# Patient Record
Sex: Female | Born: 1960
Health system: Southern US, Community
[De-identification: ages and names within clinical notes are randomized; demographics above are authoritative.]

## PROBLEM LIST (undated history)

## (undated) DIAGNOSIS — I639 Cerebral infarction, unspecified: Secondary | ICD-10-CM

## (undated) DIAGNOSIS — I10 Essential (primary) hypertension: Secondary | ICD-10-CM

## (undated) DIAGNOSIS — E119 Type 2 diabetes mellitus without complications: Secondary | ICD-10-CM

---

## 2007-03-11 ENCOUNTER — Emergency Department (HOSPITAL_COMMUNITY): Admission: EM | Admit: 2007-03-11 | Discharge: 2007-03-11 | Payer: Self-pay | Admitting: Emergency Medicine

## 2012-03-11 ENCOUNTER — Other Ambulatory Visit (HOSPITAL_COMMUNITY)
Admission: RE | Admit: 2012-03-11 | Discharge: 2012-03-11 | Disposition: A | Discharge status: Home / Self Care | Payer: Commercial Indemnity | Source: Ambulatory Visit | Attending: Family Medicine | Admitting: Family Medicine

## 2012-03-11 DIAGNOSIS — Z124 Encounter for screening for malignant neoplasm of cervix: Secondary | ICD-10-CM | POA: Insufficient documentation

## 2012-03-11 DIAGNOSIS — Z1151 Encounter for screening for human papillomavirus (HPV): Secondary | ICD-10-CM | POA: Insufficient documentation

## 2014-07-17 ENCOUNTER — Emergency Department (HOSPITAL_COMMUNITY): Payer: Commercial Indemnity

## 2014-07-17 ENCOUNTER — Emergency Department (HOSPITAL_COMMUNITY)
Admission: EM | Admit: 2014-07-17 | Discharge: 2014-07-17 | Disposition: A | Discharge status: Home / Self Care | Payer: Commercial Indemnity | Attending: Emergency Medicine | Admitting: Emergency Medicine

## 2014-07-17 ENCOUNTER — Encounter (HOSPITAL_COMMUNITY): Payer: Self-pay | Admitting: *Deleted

## 2014-07-17 DIAGNOSIS — R002 Palpitations: Secondary | ICD-10-CM | POA: Diagnosis not present

## 2014-07-17 DIAGNOSIS — R000 Tachycardia, unspecified: Secondary | ICD-10-CM | POA: Diagnosis not present

## 2014-07-17 LAB — TSH: TSH: 2.249 u[IU]/mL (ref 0.350–4.500)

## 2014-07-17 LAB — URINE MICROSCOPIC-ADD ON

## 2014-07-17 LAB — I-STAT TROPONIN, ED: TROPONIN I, POC: 0 ng/mL (ref 0.00–0.08)

## 2014-07-17 LAB — URINALYSIS, ROUTINE W REFLEX MICROSCOPIC
Bilirubin Urine: NEGATIVE
GLUCOSE, UA: NEGATIVE mg/dL
Hgb urine dipstick: NEGATIVE
KETONES UR: 15 mg/dL — AB
NITRITE: NEGATIVE
PH: 6.5 (ref 5.0–8.0)
Protein, ur: NEGATIVE mg/dL
Specific Gravity, Urine: 1.024 (ref 1.005–1.030)
UROBILINOGEN UA: 1 mg/dL (ref 0.0–1.0)

## 2014-07-17 LAB — RAPID URINE DRUG SCREEN, HOSP PERFORMED
AMPHETAMINES: NOT DETECTED
BARBITURATES: NOT DETECTED
BENZODIAZEPINES: NOT DETECTED
Cocaine: NOT DETECTED
Opiates: NOT DETECTED
Tetrahydrocannabinol: NOT DETECTED

## 2014-07-17 LAB — CBC
HEMATOCRIT: 42 % (ref 36.0–46.0)
HEMOGLOBIN: 14.2 g/dL (ref 12.0–15.0)
MCH: 31.5 pg (ref 26.0–34.0)
MCHC: 33.8 g/dL (ref 30.0–36.0)
MCV: 93.1 fL (ref 78.0–100.0)
Platelets: 294 10*3/uL (ref 150–400)
RBC: 4.51 MIL/uL (ref 3.87–5.11)
RDW: 12.9 % (ref 11.5–15.5)
WBC: 9.6 10*3/uL (ref 4.0–10.5)

## 2014-07-17 LAB — BASIC METABOLIC PANEL
Anion gap: 15 (ref 5–15)
BUN: 9 mg/dL (ref 6–23)
CALCIUM: 10 mg/dL (ref 8.4–10.5)
CHLORIDE: 98 meq/L (ref 96–112)
CO2: 25 mmol/L (ref 19–32)
Creatinine, Ser: 0.69 mg/dL (ref 0.50–1.10)
GFR calc Af Amer: >90 mL/min (ref >90)
GFR calc non Af Amer: >90 mL/min (ref >90)
Glucose, Bld: 173 mg/dL — ABNORMAL HIGH (ref 70–99)
POTASSIUM: 3.4 mmol/L — AB (ref 3.5–5.1)
Sodium: 138 mmol/L (ref 135–145)

## 2014-07-17 MED ORDER — LORAZEPAM 2 MG/ML IJ SOLN
1.0000 mg | Freq: Once | INTRAMUSCULAR | Status: AC
  Administered 2014-07-17: 1 mg via INTRAVENOUS
  Filled 2014-07-17: qty 1

## 2014-07-17 MED ORDER — SODIUM CHLORIDE 0.9 % IV BOLUS (SEPSIS)
1000.0000 mL | Freq: Once | INTRAVENOUS | Status: AC
  Administered 2014-07-17: 1000 mL via INTRAVENOUS

## 2014-07-17 MED ORDER — METOPROLOL TARTRATE 25 MG PO TABS: 25.0000 mg | ORAL_TABLET | Freq: Two times a day (BID) | ORAL | Status: AC

## 2014-07-17 MED ORDER — SODIUM CHLORIDE 0.9 % IV BOLUS (SEPSIS)
500.0000 mL | Freq: Once | INTRAVENOUS | Status: AC
  Administered 2014-07-17: 500 mL via INTRAVENOUS

## 2014-07-17 MED ORDER — LORAZEPAM 1 MG PO TABS: 1.0000 mg | ORAL_TABLET | Freq: Three times a day (TID) | ORAL | Status: AC | PRN

## 2014-07-17 NOTE — Discharge Instructions (Signed)
Palpitations  A palpitation is the feeling that your heartbeat is irregular. It may feel like your heart is fluttering or skipping a beat. It may also feel like your heart is beating faster than normal. This is usually not a serious problem. In some cases, you may need more medical tests.  HOME CARE  · Avoid:  ¨ Caffeine in coffee, tea, soft drinks, diet pills, and energy drinks.  ¨ Chocolate.  ¨ Alcohol.  · Stop smoking if you smoke.  · Reduce your stress and anxiety. Try:  ¨ A method that measures bodily functions so you can learn to control them (biofeedback).  ¨ Yoga.  ¨ Meditation.  ¨ Physical activity such as swimming, jogging, or walking.  · Get plenty of rest and sleep.  GET HELP IF:  · Your fast or irregular heartbeat continues after 24 hours.  · Your palpitations occur more often.  GET HELP RIGHT AWAY IF:   · You have chest pain.  · You feel short of breath.  · You have a very bad headache.  · You feel dizzy or pass out (faint).  MAKE SURE YOU:   · Understand these instructions.  · Will watch your condition.  · Will get help right away if you are not doing well or get worse.  Document Released: 03/28/2008 Document Revised: 11/03/2013 Document Reviewed: 08/18/2011  [employer]® Patient Information ©2015 [employer]. This information is not intended to replace advice given to you by your health care provider. Make sure you discuss any questions you have with your health care provider.

## 2014-07-17 NOTE — ED Notes (Addendum)
Pt reports feeling like her heart is racing x 1 week and pt feels shakey and jittery. "feels like she is going to have a breakdown."  HR 155 at triage. Denies sob.

## 2014-07-17 NOTE — ED Notes (Signed)
Dr Beaton at bedside 

## 2014-07-17 NOTE — ED Notes (Signed)
Pt unable to urinate at this time.  

## 2014-07-17 NOTE — ED Provider Notes (Signed)
CSN: [id number]     Arrival date & time 07/17/14  1150 History   First MD Initiated Contact with Patient 07/17/14 1254     Chief Complaint  Patient presents with  . Palpitations      HPI  Patient presents with 2-3 week history of rapid heartbeat feeling jittery.  Denies shortness of breath.  Patient denies leg swelling or leg pain.  No previous history of blood clots or pulmonary emboli.  Patient does admit to drinking vodka at least every other day.  Last drink she had was yesterday.  Patient denies vomiting or diarrhea.  Denies chest pain.  Denies other drug abuse.      History reviewed. No pertinent past medical history. History reviewed. No pertinent past surgical history. History reviewed. No pertinent family history. History  Substance Use Topics  . Smoking status: Not on file  . Smokeless tobacco: Not on file  . Alcohol Use: Yes     Comment: occ   OB History    No data available     Review of Systems  Review of systems  Allergies  Review of patient's allergies indicates no known allergies.  Home Medications   Prior to Admission medications   Not on File   BP 122/85 mmHg  Pulse 83  Temp(Src) 98 F (36.7 C) (Oral)  Resp 18  SpO2 98% Physical Exam  Constitutional: She is oriented to person, place, and time. She appears well-developed and well-nourished. No distress.  HENT:  Head: Normocephalic and atraumatic.  Eyes: Pupils are equal, round, and reactive to light.  Neck: Normal range of motion.  Cardiovascular: Intact distal pulses.  Tachycardia present.   Pulmonary/Chest: No respiratory distress.  Abdominal: Normal appearance. She exhibits no distension.  Musculoskeletal: Normal range of motion.  Neurological: She is alert and oriented to person, place, and time. No cranial nerve deficit.  Skin: Skin is warm and dry. No rash noted.  Psychiatric: She has a normal mood and affect. Her behavior is normal.  Nursing note and vitals reviewed.   ED Course   Procedures (including critical care time) Medications  sodium chloride 0.9 % bolus 1,000 mL (0 mLs Intravenous Stopped 07/17/14 1449)  LORazepam (ATIVAN) injection 1 mg (1 mg Intravenous Given 07/17/14 1450)  sodium chloride 0.9 % bolus 500 mL (0 mLs Intravenous Stopped 07/17/14 1533)  LORazepam (ATIVAN) injection 1 mg (1 mg Intravenous Given 07/17/14 1543)  LORazepam (ATIVAN) injection 1 mg (1 mg Intravenous Given 07/17/14 1614)    Labs Review Labs Reviewed  BASIC METABOLIC PANEL - Abnormal; Notable for the following:    Potassium 3.4 (*)    Glucose, Bld 173 (*)    All other components within normal limits  URINALYSIS, ROUTINE W REFLEX MICROSCOPIC - Abnormal; Notable for the following:    Color, Urine AMBER (*)    APPearance TURBID (*)    Ketones, ur 15 (*)    Leukocytes, UA SMALL (*)    All other components within normal limits  URINE MICROSCOPIC-ADD ON - Abnormal; Notable for the following:    Squamous Epithelial / LPF FEW (*)    All other components within normal limits  CBC  TSH  URINE RAPID DRUG SCREEN (HOSP PERFORMED)  I-STAT TROPOININ, ED    Imaging Review Dg Chest Portable 1 View  07/17/2014   CLINICAL DATA:  Palpitations.  Dizziness.  EXAM: PORTABLE CHEST - 1 VIEW  COMPARISON:  None.  FINDINGS: The heart size and mediastinal contours are within normal limits. Both lungs  are clear. No pneumothorax or pleural effusion is noted. The visualized skeletal structures are unremarkable.  IMPRESSION: No acute cardiopulmonary abnormality seen.   Electronically Signed   By: Roque LiasJames  Green M.D.   On: 07/17/2014 13:57     EKG Interpretation   Date/Time:  Friday July 17 2014 11:58:08 EST Ventricular Rate:  154 PR Interval:  132 QRS Duration: 68 QT Interval:  250 QTC Calculation: 400 R Axis:   70 Text Interpretation:  Sinus tachycardia Otherwise normal ECG No previous  tracing Confirmed by [place] Lapaglia  MD, Hale Chalfin ([id number]) on 07/17/2014 12:58:21 PM     After treatment in the ED the  patient feels back to baseline and wants to go home.  Patient's heart rate has dropped to as low as 118.  Patient states she feels much better.  We'll plan on discharge in on Ativan.  I suspect that her tachycardia may be related to her alcohol and alcohol withdrawal.   MDM   Final diagnoses:  Palpitations        Maria Shiobert L Evann Koelzer, MD 07/17/14 1623

## 2014-10-16 ENCOUNTER — Encounter (HOSPITAL_COMMUNITY): Payer: Self-pay | Admitting: Emergency Medicine

## 2014-10-16 ENCOUNTER — Emergency Department (HOSPITAL_COMMUNITY): Payer: Commercial Indemnity

## 2014-10-16 ENCOUNTER — Emergency Department (HOSPITAL_COMMUNITY)
Admission: EM | Admit: 2014-10-16 | Discharge: 2014-10-16 | Disposition: A | Discharge status: Home / Self Care | Payer: Commercial Indemnity | Attending: Emergency Medicine | Admitting: Emergency Medicine

## 2014-10-16 ENCOUNTER — Emergency Department (HOSPITAL_COMMUNITY)
Admission: EM | Admit: 2014-10-16 | Discharge: 2014-10-16 | Disposition: A | Discharge status: Nursing Home (Includes ICF) | Payer: Commercial Indemnity | Source: Home / Self Care | Attending: Family Medicine | Admitting: Family Medicine

## 2014-10-16 ENCOUNTER — Encounter (HOSPITAL_COMMUNITY): Payer: Self-pay | Admitting: *Deleted

## 2014-10-16 DIAGNOSIS — R000 Tachycardia, unspecified: Secondary | ICD-10-CM | POA: Diagnosis not present

## 2014-10-16 DIAGNOSIS — Z8673 Personal history of transient ischemic attack (TIA), and cerebral infarction without residual deficits: Secondary | ICD-10-CM | POA: Insufficient documentation

## 2014-10-16 DIAGNOSIS — R519 Headache, unspecified: Secondary | ICD-10-CM

## 2014-10-16 DIAGNOSIS — E119 Type 2 diabetes mellitus without complications: Secondary | ICD-10-CM | POA: Insufficient documentation

## 2014-10-16 DIAGNOSIS — R51 Headache: Secondary | ICD-10-CM | POA: Diagnosis not present

## 2014-10-16 DIAGNOSIS — I10 Essential (primary) hypertension: Secondary | ICD-10-CM

## 2014-10-16 DIAGNOSIS — Z79899 Other long term (current) drug therapy: Secondary | ICD-10-CM | POA: Insufficient documentation

## 2014-10-16 HISTORY — DX: Essential (primary) hypertension: I10

## 2014-10-16 HISTORY — DX: Type 2 diabetes mellitus without complications: E11.9

## 2014-10-16 HISTORY — DX: Cerebral infarction, unspecified: I63.9

## 2014-10-16 LAB — URINALYSIS, ROUTINE W REFLEX MICROSCOPIC
Bilirubin Urine: NEGATIVE
Glucose, UA: NEGATIVE mg/dL
Hgb urine dipstick: NEGATIVE
KETONES UR: 40 mg/dL — AB
NITRITE: NEGATIVE
Protein, ur: NEGATIVE mg/dL
Specific Gravity, Urine: 1.019 (ref 1.005–1.030)
Urobilinogen, UA: 0.2 mg/dL (ref 0.0–1.0)
pH: 6 (ref 5.0–8.0)

## 2014-10-16 LAB — BASIC METABOLIC PANEL
Anion gap: 16 — ABNORMAL HIGH (ref 5–15)
BUN: 11 mg/dL (ref 6–23)
CO2: 24 mmol/L (ref 19–32)
CREATININE: 0.56 mg/dL (ref 0.50–1.10)
Calcium: 9.6 mg/dL (ref 8.4–10.5)
Chloride: 103 mmol/L (ref 96–112)
GFR calc Af Amer: >90 mL/min (ref >90)
Glucose, Bld: 99 mg/dL (ref 70–99)
Potassium: 3.4 mmol/L — ABNORMAL LOW (ref 3.5–5.1)
Sodium: 143 mmol/L (ref 135–145)

## 2014-10-16 LAB — I-STAT TROPONIN, ED: Troponin i, poc: 0 ng/mL (ref 0.00–0.08)

## 2014-10-16 LAB — CBC WITH DIFFERENTIAL/PLATELET
BASOS PCT: 0 % (ref 0–1)
Basophils Absolute: 0 10*3/uL (ref 0.0–0.1)
Eosinophils Absolute: 0.1 10*3/uL (ref 0.0–0.7)
Eosinophils Relative: 0 % (ref 0–5)
HEMATOCRIT: 40.2 % (ref 36.0–46.0)
HEMOGLOBIN: 13.1 g/dL (ref 12.0–15.0)
LYMPHS PCT: 15 % (ref 12–46)
Lymphs Abs: 2.5 10*3/uL (ref 0.7–4.0)
MCH: 30 pg (ref 26.0–34.0)
MCHC: 32.6 g/dL (ref 30.0–36.0)
MCV: 92.2 fL (ref 78.0–100.0)
MONO ABS: 0.9 10*3/uL (ref 0.1–1.0)
Monocytes Relative: 5 % (ref 3–12)
NEUTROS ABS: 13 10*3/uL — AB (ref 1.7–7.7)
NEUTROS PCT: 80 % — AB (ref 43–77)
Platelets: 291 10*3/uL (ref 150–400)
RBC: 4.36 MIL/uL (ref 3.87–5.11)
RDW: 13.6 % (ref 11.5–15.5)
WBC: 16.4 10*3/uL — ABNORMAL HIGH (ref 4.0–10.5)

## 2014-10-16 LAB — URINE MICROSCOPIC-ADD ON

## 2014-10-16 LAB — PROTIME-INR
INR: 1.07 (ref 0.00–1.49)
Prothrombin Time: 14 seconds (ref 11.6–15.2)

## 2014-10-16 MED ORDER — LABETALOL HCL 5 MG/ML IV SOLN
10.0000 mg | Freq: Once | INTRAVENOUS | Status: AC
  Administered 2014-10-16: 10 mg via INTRAVENOUS
  Filled 2014-10-16: qty 4

## 2014-10-16 MED ORDER — BUTALBITAL-APAP-CAFFEINE 50-325-40 MG PO TABS
2.0000 | ORAL_TABLET | Freq: Once | ORAL | Status: AC
  Administered 2014-10-16: 2 via ORAL
  Filled 2014-10-16: qty 2

## 2014-10-16 MED ORDER — SODIUM CHLORIDE 0.9 % IV BOLUS (SEPSIS): 1000.0000 mL | Freq: Once | INTRAVENOUS | Status: DC

## 2014-10-16 MED ORDER — METOCLOPRAMIDE HCL 5 MG/ML IJ SOLN
10.0000 mg | Freq: Once | INTRAMUSCULAR | Status: AC
  Administered 2014-10-16: 10 mg via INTRAVENOUS
  Filled 2014-10-16: qty 2

## 2014-10-16 MED ORDER — DIPHENHYDRAMINE HCL 50 MG/ML IJ SOLN
25.0000 mg | Freq: Once | INTRAMUSCULAR | Status: AC
  Administered 2014-10-16: 25 mg via INTRAVENOUS
  Filled 2014-10-16: qty 1

## 2014-10-16 MED ORDER — SODIUM CHLORIDE 0.9 % IV BOLUS (SEPSIS)
1000.0000 mL | Freq: Once | INTRAVENOUS | Status: AC
Start: 2014-10-16 — End: 2014-10-16
  Administered 2014-10-16: 1000 mL via INTRAVENOUS

## 2014-10-16 MED ORDER — IOHEXOL 350 MG/ML SOLN
50.0000 mL | Freq: Once | INTRAVENOUS | Status: AC | PRN
  Administered 2014-10-16: 50 mL via INTRAVENOUS

## 2014-10-16 NOTE — ED Notes (Signed)
MD Rees at bedside. 

## 2014-10-16 NOTE — ED Notes (Signed)
Pt returned from xray

## 2014-10-16 NOTE — ED Provider Notes (Addendum)
CSN: [id number]     Arrival date & time 10/16/14  1653 History   None    Chief Complaint  Patient presents with  . Headache  . Hypertension  . Tachycardia   (Consider location/radiation/quality/duration/timing/severity/associated sxs/prior Treatment) Patient is a 54 y.o. female presenting with hypertension. The history is provided by the patient.  Hypertension This is a new problem. The current episode started 2 days ago. The problem has been gradually worsening. Associated symptoms include headaches. Pertinent negatives include no chest pain. Associated symptoms comments: Heart racing.Marland Kitchen.    History reviewed. No pertinent past medical history. History reviewed. No pertinent past surgical history. History reviewed. No pertinent family history. History  Substance Use Topics  . Smoking status: Never Smoker   . Smokeless tobacco: Not on file  . Alcohol Use: Yes     Comment: occ   OB History    No data available     Review of Systems  Constitutional: Negative.   Respiratory: Negative.   Cardiovascular: Positive for palpitations. Negative for chest pain and leg swelling.  Gastrointestinal: Negative.   Neurological: Positive for headaches.    Allergies  Review of patient's allergies indicates no known allergies.  Home Medications   Prior to Admission medications   Medication Sig Start Date End Date Taking? Authorizing Provider  diltiazem (CARDIZEM) 60 MG tablet Take 60 mg by mouth daily.   Yes Historical Provider, MD  LORazepam (ATIVAN) 1 MG tablet Take 1 tablet (1 mg total) by mouth 3 (three) times daily as needed for anxiety. 07/17/14   Nelva Nayobert Beaton, MD  metoprolol (LOPRESSOR) 25 MG tablet Take 1 tablet (25 mg total) by mouth 2 (two) times daily. 07/17/14   Nelva Nayobert Beaton, MD   BP 199/112 mmHg  Temp(Src) 97.8 F (36.6 C) (Oral)  Resp 16  SpO2 100% Physical Exam  Constitutional: She is oriented to person, place, and time. She appears well-developed and well-nourished.   Neck: Normal range of motion. Neck supple.  Cardiovascular: Regular rhythm, intact distal pulses and normal pulses.  Tachycardia present.   Murmur heard.  Crescendo systolic murmur is present with a grade of 2/6  Pulmonary/Chest: Effort normal and breath sounds normal.  Abdominal: Soft. Bowel sounds are normal.  Lymphadenopathy:    She has no cervical adenopathy.  Neurological: She is alert and oriented to person, place, and time.  Skin: Skin is dry.  Nursing note and vitals reviewed.   ED Course  Procedures (including critical care time) Labs Review Labs Reviewed - No data to display ecg- sinus tach 150, no acute changes. Imaging Review No results found.   MDM   1. Tachycardia with 141 - 160 beats per minute   2. Essential hypertension    Pt sent for tachycardia, hbp and worst headache problem.    Linna HoffJames D Kindl, MD 10/16/14 1755  Linna HoffJames D Kindl, MD 10/16/14 1755  Linna HoffJames D Kindl, MD 10/16/14 [id number]

## 2014-10-16 NOTE — ED Notes (Signed)
CT notified about IV placement.

## 2014-10-16 NOTE — ED Notes (Signed)
Per [facility] pt has had headache and neck pain X 3 days. Pt hasn't been taking her prescribed heart medication. Pt was hypertensive and tachycardic at urgent care, Pt was sent here for further evaluation.VS are as follows: BP:198/112, HR:148, O2sat:100% on RA.

## 2014-10-16 NOTE — ED Notes (Signed)
Pt states that she has neck and back pain for a month. Pt vital signs show her heart rate is elevated along with a elevated blood pressure. Pt had EKG done and noted to be Sinus Tachycardia. Pt will be transferred to the ED via EMS. Report called to charge nurse [place] RN.

## 2014-10-16 NOTE — ED Provider Notes (Signed)
CSN: [id number]     Arrival date & time 10/16/14  1836 History   First MD Initiated Contact with Patient 10/16/14 1848     Chief Complaint  Patient presents with  . Tachycardia     The history is provided by the patient. No language interpreter was used.   Maria Newman presents from urgent care for evaluation of headache. She has a headache that's been present for between 2 days to a week. The headache was gradual in onset and it is waxing and waning. She describes as a sinus headache. It is located in the front of her head and goes around to the back. She has associated pain on the back of her neck. She states she has chronic neck pain. She went to urgent care today for evaluation and they referred her to the emergency department. She denies any fevers, vomiting, numbness, weakness. She was seen in the emergency department 2 months ago he was diagnosed with tachycardia and started on a medication for rate control. She did not like the way the medication made her feel so she stopped taking it. She followed up with her family doctor and they prescribed her a need a different medication. She did not take that medication until today. 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 She denies any other medical problems.  Past Medical History  Diagnosis Date  . Diabetes mellitus without complication   . Stroke    History reviewed. No pertinent past surgical history. No family history on file. History  Substance Use Topics  . Smoking status: Never Smoker   . Smokeless tobacco: Not on file  . Alcohol Use: 6.0 oz/week    10 Shots of liquor per week     Comment: occ   OB History    No data available     Review of Systems  All other systems reviewed and are negative.     Allergies  Review of patient's allergies indicates no known allergies.  Home Medications   Prior to Admission medications   Medication Sig Start Date End Date Taking? Authorizing Provider  diltiazem (CARDIZEM) 60 MG  tablet Take 60 mg by mouth daily.    Historical Provider, MD  LORazepam (ATIVAN) 1 MG tablet Take 1 tablet (1 mg total) by mouth 3 (three) times daily as needed for anxiety. 07/17/14   Nelva Nayobert Beaton, MD  metoprolol (LOPRESSOR) 25 MG tablet Take 1 tablet (25 mg total) by mouth 2 (two) times daily. 07/17/14   Nelva Nayobert Beaton, MD   BP 147/90 mmHg  Pulse 147  Temp(Src) 98.8 F (37.1 C) (Oral)  Resp 18  Ht 5\' 3"  (1.6 m)  Wt 140 lb (63.504 kg)  BMI 24.81 kg/m2  SpO2 100% Physical Exam  Constitutional: She is oriented to person, place, and time. She appears well-developed and well-nourished.  HENT:  Head: Normocephalic and atraumatic.  Eyes: EOM are normal. Pupils are equal, round, and reactive to light.  Neck: Neck supple.  Cardiovascular: Regular rhythm.   No murmur heard. Tachycardic  Pulmonary/Chest: Effort normal and breath sounds normal. No respiratory distress.  Abdominal: Soft. There is no tenderness. There is no rebound and no guarding.  Musculoskeletal: She exhibits no edema or tenderness.  Contracture of the right elbow and hand.  Neurological: She is alert and oriented to person, place, and time. No cranial nerve deficit.  Skin: Skin is warm and dry.  Psychiatric: She has a normal mood and affect. Her behavior is normal.  Nursing note and vitals reviewed.   ED Course  Procedures (including critical care time) Labs Review Labs Reviewed  CBC WITH DIFFERENTIAL/PLATELET - Abnormal; Notable for the following:    WBC 16.4 (*)    Neutrophils Relative % 80 (*)    Neutro Abs 13.0 (*)    All other components within normal limits  URINALYSIS, ROUTINE W REFLEX MICROSCOPIC - Abnormal; Notable for the following:    Ketones, ur 40 (*)    Leukocytes, UA TRACE (*)    All other components within normal limits  BASIC METABOLIC PANEL - Abnormal; Notable for the following:    Potassium 3.4 (*)    Anion gap 16 (*)    All other components within normal limits  URINE MICROSCOPIC-ADD ON -  Abnormal; Notable for the following:    Squamous Epithelial / LPF FEW (*)    Bacteria, UA FEW (*)    Casts HYALINE CASTS (*)    All other components within normal limits  PROTIME-INR  I-STAT TROPOININ, ED    Imaging Review Ct Angio Head W/cm &/or Wo Cm  10/16/2014   CLINICAL DATA:  Initial evaluation for neck pain and frontal headache for 3 days. Tachycardia, elevated blood pressure.  EXAM: CT ANGIOGRAPHY HEAD AND NECK  TECHNIQUE: Multidetector CT imaging of the head and neck was performed using the standard protocol during bolus administration of intravenous contrast. Multiplanar CT image reconstructions and MIPs were obtained to evaluate the vascular anatomy. Carotid stenosis measurements (when applicable) are obtained utilizing NASCET criteria, using the distal internal carotid diameter as the denominator.  CONTRAST:  50mL OMNIPAQUE IOHEXOL 350 MG/ML SOLN  COMPARISON:  Prior noncontrast head CT performed earlier on the same day.  FINDINGS: CTA NECK  Aortic arch: Visualize aortic arch is of normal caliber. Incidental note made of a bovine arch with common origin of the left common and right brachiocephalic arteries. Mild calcified plaque present within the arch itself. No high-grade stenosis seen at the origin of the great vessels. Visualized subclavian arteries widely patent and unremarkable in appearance.  Right carotid system: Proximal right common carotid artery is tortuous. Right common carotid artery otherwise unremarkable and is widely patent from its origin to the carotid bifurcation. Right internal carotid artery widely patent from the carotid bifurcation to the skullbase. Single small calcified plaque present at the proximal right ICA without significant stenosis. No evidence for dissection, stenosis, or occlusion within the right ICA.  Left carotid system: Left common carotid artery is widely patent from its origin to the left carotid bifurcation. Left ICA well opacified from the carotid  bifurcation to the skullbase. No evidence for dissection, stenosis, or occlusion.  Vertebral arteries:Both vertebral arteries arise from the subclavian arteries. Right vertebral artery is dominant. Vertebral arteries are well opacified without evidence for dissection, occlusion, or stenosis.  Skeleton: Mild multilevel degenerative changes present within the visualized cervical spine. No acute osseous abnormality. No worrisome lytic or blastic osseous lesion.  Other neck: No acute soft tissue abnormality within the neck. Shotty level 2 adenopathy noted bilaterally. No pathologically enlarged lymph nodes. Thyroid gland normal. Visualized superior mediastinum within normal limits. Visualized lung apices are clear.  CTA HEAD  Anterior circulation: The petrous, cavernous, and supra clinoid segments of the internal carotid arteries are widely patent bilaterally. Left A1 segment widely patent. The right A1 segment is hypoplastic. Anterior communicating artery and anterior cerebral arteries well opacified.  Right M1 segment widely patent without stenosis or occlusion. Distal right MCA branches within normal limits.  The left M1 segment is markedly diminutive and thin, likely related to remote MCA infarct. The distal MCA branches are attenuated, also likely related to remote ischemia.  Posterior circulation: Dominant right vertebral artery widely patent to the vertebrobasilar junction. The diminutive left vertebral artery divides at the takeoff of the left posterior inferior cerebellar artery, with a smaller hypoplastic branch ascending towards the vertebrobasilar junction. Posterior inferior cerebral arteries and cells are patent. Right anterior inferior cerebral artery dominant. Basilar artery widely patent. Superior cerebellar arteries and posterior cerebral arteries patent bilaterally. Patent posterior communicating arteries present bilaterally.  Venous sinuses: No acute abnormality involving the venous sinuses  identified.  Anatomic variants: No anatomic variant.  No aneurysm.  Delayed phase: No abnormal enhancement on delayed sequence.  IMPRESSION: 1. Unremarkable CTA of the neck. 2. No acute abnormality within the intracranial circulation. 3. Diminutive left M1 segment with attenuation of the distal left MCA branches, likely related to remote left MCA territory infarct. 4. No other significant stenosis identified within the intracranial circulation. No aneurysm.   Electronically Signed   By: Rise Mu M.D.   On: 10/16/2014 22:43   Ct Head Wo Contrast  10/16/2014   CLINICAL DATA:  Two day history of tachycardia and headaches.  EXAM: CT HEAD WITHOUT CONTRAST  TECHNIQUE: Contiguous axial images were obtained from the base of the skull through the vertex without intravenous contrast.  COMPARISON:  03/21/2007  FINDINGS: Remote left MCA territory infarct with large area of encephalomalacia and ex vacuo dilatation of the left lateral ventricle. No extra-axial fluid collections are identified. No CT findings for acute hemispheric infarction or intracranial hemorrhage. No mass lesions. The brainstem and cerebellum appear normal.  The bony structures are unremarkable. The paranasal sinuses and mastoid air cells are clear. The globes are intact.  IMPRESSION: 1. Remote left MCA territory infarction with encephalomalacia. 2. No acute intracranial findings or mass lesions.   Electronically Signed   By: Rudie Meyer M.D.   On: 10/16/2014 19:35   Ct Angio Neck W/cm &/or Wo/cm  10/16/2014   CLINICAL DATA:  Initial evaluation for neck pain and frontal headache for 3 days. Tachycardia, elevated blood pressure.  EXAM: CT ANGIOGRAPHY HEAD AND NECK  TECHNIQUE: Multidetector CT imaging of the head and neck was performed using the standard protocol during bolus administration of intravenous contrast. Multiplanar CT image reconstructions and MIPs were obtained to evaluate the vascular anatomy. Carotid stenosis measurements  (when applicable) are obtained utilizing NASCET criteria, using the distal internal carotid diameter as the denominator.  CONTRAST:  50mL OMNIPAQUE IOHEXOL 350 MG/ML SOLN  COMPARISON:  Prior noncontrast head CT performed earlier on the same day.  FINDINGS: CTA NECK  Aortic arch: Visualize aortic arch is of normal caliber. Incidental note made of a bovine arch with common origin of the left common and right brachiocephalic arteries. Mild calcified plaque present within the arch itself. No high-grade stenosis seen at the origin of the great vessels. Visualized subclavian arteries widely patent and unremarkable in appearance.  Right carotid system: Proximal right common carotid artery is tortuous. Right common carotid artery otherwise unremarkable and is widely patent from its origin to the carotid bifurcation. Right internal carotid artery widely patent from the carotid bifurcation to the skullbase. Single small calcified plaque present at the proximal right ICA without significant stenosis. No evidence for dissection, stenosis, or occlusion within the right ICA.  Left carotid system: Left common carotid artery is widely patent from its origin to the left carotid bifurcation.  Left ICA well opacified from the carotid bifurcation to the skullbase. No evidence for dissection, stenosis, or occlusion.  Vertebral arteries:Both vertebral arteries arise from the subclavian arteries. Right vertebral artery is dominant. Vertebral arteries are well opacified without evidence for dissection, occlusion, or stenosis.  Skeleton: Mild multilevel degenerative changes present within the visualized cervical spine. No acute osseous abnormality. No worrisome lytic or blastic osseous lesion.  Other neck: No acute soft tissue abnormality within the neck. Shotty level 2 adenopathy noted bilaterally. No pathologically enlarged lymph nodes. Thyroid gland normal. Visualized superior mediastinum within normal limits. Visualized lung apices are  clear.  CTA HEAD  Anterior circulation: The petrous, cavernous, and supra clinoid segments of the internal carotid arteries are widely patent bilaterally. Left A1 segment widely patent. The right A1 segment is hypoplastic. Anterior communicating artery and anterior cerebral arteries well opacified.  Right M1 segment widely patent without stenosis or occlusion. Distal right MCA branches within normal limits.  The left M1 segment is markedly diminutive and thin, likely related to remote MCA infarct. The distal MCA branches are attenuated, also likely related to remote ischemia.  Posterior circulation: Dominant right vertebral artery widely patent to the vertebrobasilar junction. The diminutive left vertebral artery divides at the takeoff of the left posterior inferior cerebellar artery, with a smaller hypoplastic branch ascending towards the vertebrobasilar junction. Posterior inferior cerebral arteries and cells are patent. Right anterior inferior cerebral artery dominant. Basilar artery widely patent. Superior cerebellar arteries and posterior cerebral arteries patent bilaterally. Patent posterior communicating arteries present bilaterally.  Venous sinuses: No acute abnormality involving the venous sinuses identified.  Anatomic variants: No anatomic variant.  No aneurysm.  Delayed phase: No abnormal enhancement on delayed sequence.  IMPRESSION: 1. Unremarkable CTA of the neck. 2. No acute abnormality within the intracranial circulation. 3. Diminutive left M1 segment with attenuation of the distal left MCA branches, likely related to remote left MCA territory infarct. 4. No other significant stenosis identified within the intracranial circulation. No aneurysm.   Electronically Signed   By: Rise Mu M.D.   On: 10/16/2014 22:43   Dg Chest Port 1 View  10/16/2014   CLINICAL DATA:  Tachycardia. Shortness of breath. History of hypertension and diabetes.  EXAM: PORTABLE CHEST - 1 VIEW  COMPARISON:   07/17/2014  FINDINGS: The heart size and mediastinal contours are within normal limits. Both lungs are clear. The visualized skeletal structures are unremarkable.  IMPRESSION: No active disease.   Electronically Signed   By: Amie Portland M.D.   On: 10/16/2014 19:19     EKG Interpretation   Date/Time:  Friday October 16 2014 18:42:37 EDT Ventricular Rate:  147 PR Interval:  124 QRS Duration: 86 QT Interval:  275 QTC Calculation: 430 R Axis:   66 Text Interpretation:  Sinus tachycardia Probable left atrial enlargement  Baseline wander in lead(s) I II aVR V3 Confirmed by Lincoln Brigham [phone]) on  10/16/2014 6:51:15 PM      MDM   Final diagnoses:  Acute nonintractable headache, unspecified headache type  Tachycardia    Patient here for evaluation of tachycardia, headache. In terms of tachycardia, this is improved after labetalol and IV fluids in the department. Patient has been prescribed metoprolol but she says she can't take it because it makes her feel poorly. She was prescribed diltiazem but hasn't taken it until today. Discussed with patient importance of medication compliance, recommend restarting metoprolol as this will be more beneficial for her tachycardia. Discussed PCP follow-up for tachycardia. Presentation  is not consistent with PE, ACS. In terms of headache, patient's history changes frequently at times she says the headache has been there for 2 days time she says for weeks. Given unclear presentation recommend lumbar puncture to further evaluate for possible meningitis and rule out subarachnoid hemorrhage and patient refuses. Discuss risks and benefits of the procedure and potential failure to treat an illness patient acknowledges this risk and continues to decline the procedure. Patient's headache is improved in the emergency department on repeat evaluation. Discussed with patient PCP follow-up as well as close return precautions for her headache. CTA obtained to rule out dissection or  aneurysmal source of her pain given patient declined LP to rule out subarachnoid hemorrhage.  Tilden Fossa, MD 10/17/14 [phone]

## 2014-10-16 NOTE — Discharge Instructions (Signed)
Take your medications as prescribed.  Your white blood cell count was elevated today, this needs to be rechecked by your family doctor.     General Headache Without Cause A headache is pain or discomfort felt around the head or neck area. The specific cause of a headache may not be found. There are many causes and types of headaches. A few common ones are:  Tension headaches.  Migraine headaches.  Cluster headaches.  Chronic daily headaches. HOME CARE INSTRUCTIONS   Keep all follow-up appointments with your caregiver or any specialist referral.  Only take over-the-counter or prescription medicines for pain or discomfort as directed by your caregiver.  Lie down in a dark, quiet room when you have a headache.  Keep a headache journal to find out what may trigger your migraine headaches. For example, write down:  What you eat and drink.  How much sleep you get.  Any change to your diet or medicines.  Try massage or other relaxation techniques.  Put ice packs or heat on the head and neck. Use these 3 to 4 times per day for 15 to 20 minutes each time, or as needed.  Limit stress.  Sit up straight, and do not tense your muscles.  Quit smoking if you smoke.  Limit alcohol use.  Decrease the amount of caffeine you drink, or stop drinking caffeine.  Eat and sleep on a regular schedule.  Get 7 to 9 hours of sleep, or as recommended by your caregiver.  Keep lights dim if bright lights bother you and make your headaches worse. SEEK MEDICAL CARE IF:   You have problems with the medicines you were prescribed.  Your medicines are not working.  You have a change from the usual headache.  You have nausea or vomiting. SEEK IMMEDIATE MEDICAL CARE IF:   Your headache becomes severe.  You have a fever.  You have a stiff neck.  You have loss of vision.  You have muscular weakness or loss of muscle control.  You start losing your balance or have trouble walking.  You  feel faint or pass out.  You have severe symptoms that are different from your first symptoms. MAKE SURE YOU:   Understand these instructions.  Will watch your condition.  Will get help right away if you are not doing well or get worse. Document Released: 06/19/2005 Document Revised: 09/11/2011 Document Reviewed: 07/05/2011 [facility] Patient Information 2015 [place], [place]. This information is not intended to replace advice given to you by your health care provider. Make sure you discuss any questions you have with your health care provider.

## 2014-10-16 NOTE — ED Notes (Signed)
Removed 22gauge IV in LT AC that was placed by UC.

## 2018-07-05 ENCOUNTER — Other Ambulatory Visit: Payer: Self-pay

## 2018-07-05 ENCOUNTER — Emergency Department (HOSPITAL_COMMUNITY): Payer: Managed Care, Other (non HMO)

## 2018-07-05 ENCOUNTER — Emergency Department (HOSPITAL_COMMUNITY)
Admission: EM | Admit: 2018-07-05 | Discharge: 2018-08-03 | Disposition: E | Payer: Managed Care, Other (non HMO) | Attending: Emergency Medicine | Admitting: Emergency Medicine

## 2018-07-05 ENCOUNTER — Encounter (HOSPITAL_COMMUNITY): Payer: Self-pay

## 2018-07-05 DIAGNOSIS — W19XXXA Unspecified fall, initial encounter: Secondary | ICD-10-CM | POA: Insufficient documentation

## 2018-07-05 DIAGNOSIS — R55 Syncope and collapse: Secondary | ICD-10-CM | POA: Diagnosis present

## 2018-07-05 DIAGNOSIS — Y929 Unspecified place or not applicable: Secondary | ICD-10-CM | POA: Insufficient documentation

## 2018-07-05 DIAGNOSIS — I469 Cardiac arrest, cause unspecified: Secondary | ICD-10-CM | POA: Diagnosis not present

## 2018-07-05 DIAGNOSIS — I10 Essential (primary) hypertension: Secondary | ICD-10-CM | POA: Insufficient documentation

## 2018-07-05 DIAGNOSIS — D649 Anemia, unspecified: Secondary | ICD-10-CM | POA: Diagnosis not present

## 2018-07-05 DIAGNOSIS — Y999 Unspecified external cause status: Secondary | ICD-10-CM | POA: Insufficient documentation

## 2018-07-05 DIAGNOSIS — Z79899 Other long term (current) drug therapy: Secondary | ICD-10-CM | POA: Insufficient documentation

## 2018-07-05 DIAGNOSIS — R4182 Altered mental status, unspecified: Secondary | ICD-10-CM

## 2018-07-05 DIAGNOSIS — E119 Type 2 diabetes mellitus without complications: Secondary | ICD-10-CM | POA: Diagnosis not present

## 2018-07-05 DIAGNOSIS — Y939 Activity, unspecified: Secondary | ICD-10-CM | POA: Diagnosis not present

## 2018-07-05 DIAGNOSIS — Y92009 Unspecified place in unspecified non-institutional (private) residence as the place of occurrence of the external cause: Secondary | ICD-10-CM

## 2018-07-05 LAB — COMPREHENSIVE METABOLIC PANEL
ALT: 55 U/L — ABNORMAL HIGH (ref 0–44)
AST: 235 U/L — AB (ref 15–41)
Albumin: 1.7 g/dL — ABNORMAL LOW (ref 3.5–5.0)
Alkaline Phosphatase: 73 U/L (ref 38–126)
Anion gap: 29 — ABNORMAL HIGH (ref 5–15)
BUN: 39 mg/dL — AB (ref 6–20)
CO2: 8 mmol/L — ABNORMAL LOW (ref 22–32)
CREATININE: 1.71 mg/dL — AB (ref 0.44–1.00)
Calcium: 8.4 mg/dL — ABNORMAL LOW (ref 8.9–10.3)
Chloride: 106 mmol/L (ref 98–111)
GFR calc Af Amer: 38 mL/min — ABNORMAL LOW (ref >60)
GFR, EST NON AFRICAN AMERICAN: 32 mL/min — AB (ref >60)
Glucose, Bld: 85 mg/dL (ref 70–99)
Potassium: 3.6 mmol/L (ref 3.5–5.1)
Sodium: 143 mmol/L (ref 135–145)
Total Bilirubin: 4.3 mg/dL — ABNORMAL HIGH (ref 0.3–1.2)
Total Protein: 6.6 g/dL (ref 6.5–8.1)

## 2018-07-05 LAB — BPAM RBC
Blood Product Expiration Date: 202001282359
Unit Type and Rh: 7300

## 2018-07-05 LAB — CBC WITH DIFFERENTIAL/PLATELET
Abs Immature Granulocytes: 1.05 10*3/uL — ABNORMAL HIGH (ref 0.00–0.07)
Basophils Absolute: 0 10*3/uL (ref 0.0–0.1)
Basophils Relative: 0 %
Eosinophils Absolute: 0.1 10*3/uL (ref 0.0–0.5)
Eosinophils Relative: 0 %
HEMATOCRIT: 11.4 % — AB (ref 36.0–46.0)
Hemoglobin: 3.2 g/dL — CL (ref 12.0–15.0)
Immature Granulocytes: 5 %
Lymphocytes Relative: 13 %
Lymphs Abs: 3 10*3/uL (ref 0.7–4.0)
MCH: 34.4 pg — ABNORMAL HIGH (ref 26.0–34.0)
MCHC: 28.1 g/dL — ABNORMAL LOW (ref 30.0–36.0)
MCV: 122.6 fL — ABNORMAL HIGH (ref 80.0–100.0)
MONO ABS: 1.8 10*3/uL — AB (ref 0.1–1.0)
Monocytes Relative: 8 %
Neutro Abs: 17.2 10*3/uL — ABNORMAL HIGH (ref 1.7–7.7)
Neutrophils Relative %: 74 %
Platelets: 150 10*3/uL (ref 150–400)
RBC: <1 MIL/uL — ABNORMAL LOW (ref 3.87–5.11)
RDW: 20.9 % — ABNORMAL HIGH (ref 11.5–15.5)
WBC: 23.1 10*3/uL — ABNORMAL HIGH (ref 4.0–10.5)
nRBC: 3.2 % — ABNORMAL HIGH (ref 0.0–0.2)

## 2018-07-05 LAB — URINALYSIS, COMPLETE (UACMP) WITH MICROSCOPIC
Bilirubin Urine: NEGATIVE
Glucose, UA: NEGATIVE mg/dL
Ketones, ur: 5 mg/dL — AB
Nitrite: NEGATIVE
Protein, ur: 30 mg/dL — AB
Specific Gravity, Urine: 1.016 (ref 1.005–1.030)
pH: 5 (ref 5.0–8.0)

## 2018-07-05 LAB — TYPE AND SCREEN
ABO/RH(D): B POS
Antibody Screen: NEGATIVE
Unit division: 0

## 2018-07-05 LAB — POC OCCULT BLOOD, ED: Fecal Occult Bld: POSITIVE — AB

## 2018-07-05 LAB — I-STAT BETA HCG BLOOD, ED (MC, WL, AP ONLY): I-stat hCG, quantitative: <5 m[IU]/mL (ref <5)

## 2018-07-05 LAB — AMMONIA: Ammonia: 197 umol/L — ABNORMAL HIGH (ref 9–35)

## 2018-07-05 LAB — CBG MONITORING, ED: Glucose-Capillary: 38 mg/dL — CL (ref 70–99)

## 2018-07-05 LAB — ETHANOL: Alcohol, Ethyl (B): <10 mg/dL (ref <10)

## 2018-07-05 LAB — RAPID URINE DRUG SCREEN, HOSP PERFORMED
Amphetamines: NOT DETECTED
BENZODIAZEPINES: NOT DETECTED
Barbiturates: NOT DETECTED
Cocaine: NOT DETECTED
Opiates: NOT DETECTED
Tetrahydrocannabinol: NOT DETECTED

## 2018-07-05 LAB — I-STAT CG4 LACTIC ACID, ED

## 2018-07-05 LAB — PREPARE RBC (CROSSMATCH)

## 2018-07-05 LAB — TSH: TSH: 3.598 u[IU]/mL (ref 0.350–4.500)

## 2018-07-05 LAB — ABO/RH: ABO/RH(D): B POS

## 2018-07-05 LAB — LIPASE, BLOOD: Lipase: 110 U/L — ABNORMAL HIGH (ref 11–51)

## 2018-07-05 MED ORDER — SODIUM CHLORIDE 0.9 % IV SOLN
8.0000 mg/h | INTRAVENOUS | Status: DC
  Filled 2018-07-05 (×3): qty 80

## 2018-07-05 MED ORDER — SODIUM CHLORIDE 0.9 % IV SOLN
50.0000 ug/h | INTRAVENOUS | Status: DC
  Filled 2018-07-05 (×3): qty 1

## 2018-07-05 MED ORDER — DEXTROSE 50 % IV SOLN
25.0000 g | Freq: Once | INTRAVENOUS | Status: AC
  Administered 2018-07-05: 25 g via INTRAVENOUS
  Filled 2018-07-05: qty 50

## 2018-07-05 MED ORDER — OCTREOTIDE LOAD VIA INFUSION
50.0000 ug | Freq: Once | INTRAVENOUS | Status: DC
  Filled 2018-07-05: qty 25

## 2018-07-05 MED ORDER — VANCOMYCIN HCL IN DEXTROSE 1-5 GM/200ML-% IV SOLN: 1000.0000 mg | Freq: Once | INTRAVENOUS | Status: DC

## 2018-07-05 MED ORDER — SODIUM CHLORIDE 0.9 % IV SOLN
2.0000 g | Freq: Once | INTRAVENOUS | Status: AC
  Administered 2018-07-05: 2 g via INTRAVENOUS
  Filled 2018-07-05: qty 2

## 2018-07-05 MED ORDER — SODIUM CHLORIDE 0.9 % IV BOLUS: 1000.0000 mL | Freq: Once | INTRAVENOUS | Status: DC

## 2018-07-05 MED ORDER — SODIUM CHLORIDE 0.9 % IV SOLN
80.0000 mg | Freq: Once | INTRAVENOUS | Status: DC
  Filled 2018-07-05: qty 80

## 2018-07-05 MED ORDER — VANCOMYCIN HCL 10 G IV SOLR
2000.0000 mg | Freq: Once | INTRAVENOUS | Status: DC
  Filled 2018-07-05: qty 2000

## 2018-07-05 MED ORDER — METRONIDAZOLE IN NACL 5-0.79 MG/ML-% IV SOLN
500.0000 mg | Freq: Three times a day (TID) | INTRAVENOUS | Status: DC
  Administered 2018-07-05: 500 mg via INTRAVENOUS
  Filled 2018-07-05: qty 100

## 2018-07-05 MED ORDER — SODIUM CHLORIDE 0.9 % IV BOLUS
1000.0000 mL | Freq: Once | INTRAVENOUS | Status: AC
  Administered 2018-07-05: 1000 mL via INTRAVENOUS

## 2018-07-05 MED ORDER — VANCOMYCIN HCL IN DEXTROSE 1-5 GM/200ML-% IV SOLN: 1000.0000 mg | INTRAVENOUS | Status: DC

## 2018-07-05 MED ORDER — SODIUM CHLORIDE 0.9 % IV SOLN: 2.0000 g | INTRAVENOUS | Status: DC

## 2018-07-05 MED FILL — Medication: Qty: 1 | Status: AC

## 2018-07-06 LAB — URINE CULTURE: Culture: NO GROWTH

## 2018-07-08 LAB — PATHOLOGIST SMEAR REVIEW

## 2018-07-09 LAB — CULTURE, BLOOD (ROUTINE X 2)

## 2018-07-10 LAB — CULTURE, BLOOD (ROUTINE X 2): Culture: NO GROWTH

## 2018-08-03 NOTE — Progress Notes (Signed)
Rt responded to code phone call and provided manual ventilation until CPR stopped per MD.

## 2018-08-03 NOTE — ED Notes (Signed)
Chaplain bedside at this time. Family members on the way to visit and will gather in the consultation room prior to saying their goodbyes to the patient.

## 2018-08-03 NOTE — ED Provider Notes (Addendum)
[facility] EMERGENCY DEPARTMENT Provider Note   CSN: [id number] Arrival date & time: 07/09/18  0736     History   Chief Complaint Chief Complaint  Patient presents with  . Fall  . Unresponsive    HPI Maria Newman is a 58 y.o. 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  Patient nonverbal.  History is provided by triage note which states that patient had a fall 3 days ago in which she hit the left side of her head and lip.  Last seen 8 hours ago eating chicken broth.  When husband tried to wake her up this morning they noted that she was altered.  She is had abdominal distention for 2 months but has not been evaluated for this.  Initial CBG is 61, patient received D10 which improved CBG to 98.  Level 5 caveat secondary to altered mental status.  HPI  Past Medical History:  Diagnosis Date  . Diabetes mellitus without complication (HCC)   . Hypertension   . Stroke [facility])     There are no active problems to display for this patient.   History reviewed. No pertinent surgical history.   OB History   No obstetric history on file.      Home Medications    Prior to Admission medications   Medication Sig Start Date End Date Taking? Authorizing Provider  diltiazem (CARDIZEM) 60 MG tablet Take 60 mg by mouth daily.    [provider]  LORazepam (ATIVAN) 1 MG tablet Take 1 tablet (1 mg total) by mouth 3 (three) times daily as needed for anxiety. 07/17/14   Nelva Nay, MD  metoprolol (LOPRESSOR) 25 MG tablet Take 1 tablet (25 mg total) by mouth 2 (two) times daily. 07/17/14   Nelva Nay, MD    Family History History reviewed. No pertinent family history.  Social History Social History   Tobacco Use  . Smoking status: Never Smoker  Substance Use Topics  . Alcohol use: Yes    Alcohol/week: 10.0 standard drinks   Types: 10 Shots of liquor per week    Comment: occ  . Drug use: No     Allergies   Patient has no known allergies.   Review of Systems Review of Systems  Unable to perform ROS: Mental status change     Physical Exam Updated Vital Signs BP (!) 100/47   Pulse 99   Temp (!) 93.7 F (34.3 C) (Rectal)   Resp 18   Ht 5\' 3"  (1.6 m)   Wt 79.4 kg   SpO2 100%   BMI 31.00 kg/m   Physical Exam Vitals signs and nursing note reviewed.  Constitutional:      General: She is in acute distress.     Appearance: She is well-developed. She is ill-appearing and toxic-appearing.     Comments: Patient awake to her name.  Grunting respirations noted.  Able to move eyes to voice. Unable to follow commands or speak.  HENT:     Head: Normocephalic and atraumatic.     Nose: Nose normal.     Mouth/Throat:     Mouth: Mucous membranes are dry.  Eyes:     General: No scleral icterus.       Right eye: No discharge.        Left eye: No discharge.     Pupils: Pupils are equal, round, and  reactive to light.     Comments: Pallor.  Neck:     Musculoskeletal: Normal range of motion and neck supple.     Comments: C-collar in place. Cardiovascular:     Rate and Rhythm: Regular rhythm. Tachycardia present.     Heart sounds: Normal heart sounds. No murmur. No friction rub. No gallop.   Pulmonary:     Effort: Pulmonary effort is normal. Tachypnea present. No respiratory distress.     Breath sounds: Normal breath sounds.  Abdominal:     General: Bowel sounds are normal. There is distension.     Palpations: Abdomen is soft.     Tenderness: There is no abdominal tenderness. There is no guarding.     Comments: Stool grossly Hemoccult positive.  Musculoskeletal: Normal range of motion.     Right lower leg: Edema present.     Left lower leg: Edema present.     Comments: Contracture right upper extremity.  Pitting edema noted in bilateral lower extremities. No bruising noted on back or abdomen.  Skin:     General: Skin is warm and dry.     Coloration: Skin is pale.     Findings: No rash.  Neurological:     Mental Status: She is alert. She is disoriented.     Motor: No abnormal muscle tone.     Coordination: Coordination normal.      ED Treatments / Results  Labs (all labs ordered are listed, but only abnormal results are displayed) Labs Reviewed  COMPREHENSIVE METABOLIC PANEL - Abnormal; Notable for the following components:      Result Value   CO2 8 (*)    BUN 39 (*)    Creatinine, Ser 1.71 (*)    Calcium 8.4 (*)    Albumin 1.7 (*)    AST 235 (*)    ALT 55 (*)    Total Bilirubin 4.3 (*)    GFR calc non Af Amer 32 (*)    GFR calc Af Amer 38 (*)    Anion gap 29 (*)    All other components within normal limits  CBC WITH DIFFERENTIAL/PLATELET - Abnormal; Notable for the following components:   WBC 23.1 (*)    RBC <1.00 (*)    Hemoglobin 3.2 (*)    HCT 11.4 (*)    MCV 122.6 (*)    MCH 34.4 (*)    MCHC 28.1 (*)    RDW 20.9 (*)    nRBC 3.2 (*)    Neutro Abs 17.2 (*)    Monocytes Absolute 1.8 (*)    Abs Immature Granulocytes 1.05 (*)    All other components within normal limits  URINALYSIS, COMPLETE (UACMP) WITH MICROSCOPIC - Abnormal; Notable for the following components:   Color, Urine AMBER (*)    APPearance CLOUDY (*)    Hgb urine dipstick SMALL (*)    Ketones, ur 5 (*)    Protein, ur 30 (*)    Leukocytes, UA MODERATE (*)    Bacteria, UA FEW (*)    All other components within normal limits  AMMONIA - Abnormal; Notable for the following components:   Ammonia 197 (*)    All other components within normal limits  LIPASE, BLOOD - Abnormal; Notable for the following components:   Lipase 110 (*)    All other components within normal limits  CBG MONITORING, ED - Abnormal; Notable for the following components:   Glucose-Capillary 38 (*)    All other components within normal limits  I-STAT  CG4 LACTIC ACID, ED - Abnormal; Notable for the following components:   Lactic  Acid, Venous >17.00 (*)    All other components within normal limits  POC OCCULT BLOOD, ED - Abnormal; Notable for the following components:   Fecal Occult Bld POSITIVE (*)    All other components within normal limits  CULTURE, BLOOD (ROUTINE X 2)  CULTURE, BLOOD (ROUTINE X 2)  URINE CULTURE  RAPID URINE DRUG SCREEN, HOSP PERFORMED  ETHANOL  TSH  PATHOLOGIST SMEAR REVIEW  I-STAT BETA HCG BLOOD, ED ([facility], [facility], [facility] ONLY)  I-STAT CHEM 8, ED  I-STAT VENOUS BLOOD GAS, ED  TYPE AND SCREEN  ABO/RH  PREPARE RBC (CROSSMATCH)    EKG EKG Interpretation  Date/Time:  Friday 07/21/18 07:45:54 EST Ventricular Rate:  103 PR Interval:    QRS Duration: 68 QT Interval:  396 QTC Calculation: 519 R Axis:   53 Text Interpretation:  Sinus tachycardia Borderline repolarization abnormality Prolonged QT interval Baseline wander in lead(s) II III aVF Since prior ECG, rate has slowed Confirmed by Alvira Monday ([id number]) on Jul 21, 2018 8:36:57 AM   Radiology Ct Head Wo Contrast  Result Date: Jul 21, 2018 CLINICAL DATA:  Recent fall.  Altered mental status.  Prior CVA EXAM: CT HEAD WITHOUT CONTRAST CT CERVICAL SPINE WITHOUT CONTRAST TECHNIQUE: Multidetector CT imaging of the head and cervical spine was performed following the standard protocol without intravenous contrast. Multiplanar CT image reconstructions of the cervical spine were also generated. COMPARISON:  Limited cervical CT March 11, 2007; head CT October 16, 2014 FINDINGS: CT HEAD FINDINGS Brain: There is relative enlargement of the left lateral ventricle compared to the right lateral ventricle, a stable finding, felt to be due to ex vacuo phenomenon on the left. There is slight midline shift toward the left, felt to be due to ex vacuo phenomenon. There is evidence of a prior infarct involving portions of the posterior left frontal, much of the left temporal, and a portion of the left anterior parietal lobes. There is no mass, hemorrhage, or  extra-axial fluid collection. There is evident small vessel disease involving the anterior limb of the left internal capsule. No acute infarct is demonstrable on this study. Vascular: No hyperdense vessel. There is calcification in each carotid siphon region. Skull: The bony calvarium appears intact. Sinuses/Orbits: There is opacification in a portion of the left sphenoid sinus. Other paranasal sinuses are clear. Orbits appear symmetric bilaterally. Other: Mastoid air cells are clear. 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 CT CERVICAL SPINE FINDINGS There is a degree of motion artifact making this study less than optimal. Alignment: There is no appreciable spondylolisthesis. Skull base and vertebrae: Skull base and craniocervical junction regions appear unremarkable. No fracture is evident given limitations due to patient motion. No blastic or lytic bone lesions are evident. Soft tissues and spinal canal: Prevertebral soft tissues and predental space regions are normal. No cord or canal hematoma is demonstrable with limitations due to motion. No paraspinous lesions evident. Disc levels: There is slight disc space narrowing at C4-5, C5-6, and C6-7. There is facet hypertrophy at multiple levels without disc extrusion or stenosis noted. No appreciable nerve root edema or effacement is evident. Upper chest: There is evidence of a left pleural effusion. There is aortic atherosclerosis. Other: There is evidence of calcification in the right carotid artery. IMPRESSION: CT head: Stable prior infarct on the left involving much of the left temporal lobe as well as portions of the posterior left frontal and anterior  left parietal lobes. There is ex vacuo phenomenon on the left with relative enlargement of the left lateral ventricle. There is slight midline shift toward the left due to this ex vacuo phenomenon. No acute infarct evident. No mass or hemorrhage. There are foci of arterial  vascular calcification. There is left sphenoid sinus disease. There is arthropathy in each temporomandibular joint. CT cervical spine: Significant limitations due to motion. No fracture is demonstrated. No spondylolisthesis. Osteoarthritic change noted at several levels. There is aortic atherosclerosis as well as right carotid artery calcification. Aortic Atherosclerosis (ICD10-I70.0). Electronically Signed   By: Bretta Bang III M.D.   On: 07/28/18 10:30   Ct Cervical Spine Wo Contrast  Result Date: 28-Jul-2018 CLINICAL DATA:  Recent fall.  Altered mental status.  Prior CVA EXAM: CT HEAD WITHOUT CONTRAST CT CERVICAL SPINE WITHOUT CONTRAST TECHNIQUE: Multidetector CT imaging of the head and cervical spine was performed following the standard protocol without intravenous contrast. Multiplanar CT image reconstructions of the cervical spine were also generated. COMPARISON:  Limited cervical CT March 11, 2007; head CT October 16, 2014 FINDINGS: CT HEAD FINDINGS Brain: There is relative enlargement of the left lateral ventricle compared to the right lateral ventricle, a stable finding, felt to be due to ex vacuo phenomenon on the left. There is slight midline shift toward the left, felt to be due to ex vacuo phenomenon. There is evidence of a prior infarct involving portions of the posterior left frontal, much of the left temporal, and a portion of the left anterior parietal lobes. There is no mass, hemorrhage, or extra-axial fluid collection. There is evident small vessel disease involving the anterior limb of the left internal capsule. No acute infarct is demonstrable on this study. Vascular: No hyperdense vessel. There is calcification in each carotid siphon region. Skull: The bony calvarium appears intact. Sinuses/Orbits: There is opacification in a portion of the left sphenoid sinus. Other paranasal sinuses are clear. Orbits appear symmetric bilaterally. Other: Mastoid air cells are clear. There is  extensive osteoarthritic change in each temporomandibular joint. CT CERVICAL SPINE FINDINGS There is a degree of motion artifact making this study less than optimal. Alignment: There is no appreciable spondylolisthesis. Skull base and vertebrae: Skull base and craniocervical junction regions appear unremarkable. No fracture is evident given limitations due to patient motion. No blastic or lytic bone lesions are evident. Soft tissues and spinal canal: Prevertebral soft tissues and predental space regions are normal. No cord or canal hematoma is demonstrable with limitations due to motion. No paraspinous lesions evident. Disc levels: There is slight disc space narrowing at C4-5, C5-6, and C6-7. There is facet hypertrophy at multiple levels without disc extrusion or stenosis noted. No appreciable nerve root edema or effacement is evident. Upper chest: There is evidence of a left pleural effusion. There is aortic atherosclerosis. Other: There is evidence of calcification in the right carotid artery. IMPRESSION: CT head: Stable prior infarct on the left involving much of the left temporal lobe as well as portions of the posterior left frontal and anterior left parietal lobes. There is ex vacuo phenomenon on the left with relative enlargement of the left lateral ventricle. There is slight midline shift toward the left due to this ex vacuo phenomenon. No acute infarct evident. No mass or hemorrhage. There are foci of arterial vascular calcification. There is left sphenoid sinus disease. There is arthropathy in each temporomandibular joint. CT cervical spine: Significant limitations due to motion. No fracture is demonstrated. No spondylolisthesis. Osteoarthritic change noted  at several levels. There is aortic atherosclerosis as well as right carotid artery calcification. Aortic Atherosclerosis (ICD10-I70.0). Electronically Signed   By: Bretta Bang III M.D.   On: 07/26/18 10:30   Dg Chest Port 1 View  Result  Date: 07/26/2018 CLINICAL DATA:  Unresponsive.  Hematemesis. EXAM: PORTABLE CHEST 1 VIEW COMPARISON:  10/16/2014; 07/17/2014 FINDINGS: Grossly unchanged enlarged cardiac silhouette and mediastinal contours with atherosclerotic plaque within the thoracic aorta. Bibasilar heterogeneous opacities, left greater than right. No pleural effusion or supine evidence of pneumothorax. No evidence of edema. No acute osseous abnormalities. IMPRESSION: 1. No definitive acute cardiopulmonary disease on this AP supine portable examination. 2. Bibasilar opacities, left greater than right, likely atelectasis. Electronically Signed   By: Simonne Come M.D.   On: 07-26-18 08:28    Procedures Procedures (including critical care time)   CRITICAL CARE Performed by: Dietrich Pates   Total critical care time: 45 minutes  Critical care time was exclusive of separately billable procedures and treating other patients.  Critical care was necessary to treat or prevent imminent or life-threatening deterioration.  Critical care was time spent personally by me on the following activities: development of treatment plan with patient and/or surrogate as well as nursing, discussions with consultants, evaluation of patient's response to treatment, examination of patient, obtaining history from patient or surrogate, ordering and performing treatments and interventions, ordering and review of laboratory studies, ordering and review of radiographic studies, pulse oximetry and re-evaluation of patient's condition.   Medications Ordered in ED Medications  metroNIDAZOLE (FLAGYL) IVPB 500 mg (0 mg Intravenous Stopped 07/26/18 1009)  pantoprazole (PROTONIX) 80 mg in sodium chloride 0.9 % 100 mL IVPB (has no administration in time range)  pantoprazole (PROTONIX) 80 mg in sodium chloride 0.9 % 250 mL (0.32 mg/mL) infusion (has no administration in time range)  octreotide (SANDOSTATIN) 2 mcg/mL load via infusion 50 mcg (has no administration in  time range)    And  octreotide (SANDOSTATIN) 500 mcg in sodium chloride 0.9 % 250 mL (2 mcg/mL) infusion (has no administration in time range)  sodium chloride 0.9 % bolus 1,000 mL (has no administration in time range)  vancomycin (VANCOCIN) 2,000 mg in sodium chloride 0.9 % 500 mL IVPB (has no administration in time range)  ceFEPIme (MAXIPIME) 2 g in sodium chloride 0.9 % 100 mL IVPB (has no administration in time range)  vancomycin (VANCOCIN) IVPB 1000 mg/200 mL premix (has no administration in time range)  sodium chloride 0.9 % bolus 1,000 mL (0 mLs Intravenous Stopped 2018-07-26 1015)  ceFEPIme (MAXIPIME) 2 g in sodium chloride 0.9 % 100 mL IVPB (0 g Intravenous Stopped 07-26-2018 0927)  dextrose 50 % solution 25 g (25 g Intravenous Given July 26, 2018 5364)     Initial Impression / Assessment and Plan / ED Course  I have reviewed the triage vital signs and the nursing notes.  Pertinent labs & imaging results that were available during my care of the patient were reviewed by me and considered in my medical decision making (see chart for details).  Clinical Course as of Jul 05 2034  Fri 07-26-18  0806 Rectal temperature is 92.6 F.   [HK]  0847 Manual BP 92/48.   [HK]  0901 Lactic Acid, Venous(!!): >17.00 [HK]  0901 Fecal Occult Blood, POC(!): POSITIVE [HK]  0901 I have attempted to reach the patient's spouse x3 unsuccessful attempts.   [HK]  6803 Glucose-Capillary(!!): 38 [HK]    Clinical Course User Index [HK] Maria Roseman, PA-C  58 year old female with a past medical history of diabetes, hypertension, prior CVA presents to ED for altered mental status.  Patient nonverbal, history is obtained from triage note and EMS.  Patient had a fall 3 days ago and was in her usual state of health until 8 hours ago.  Husband tried to wake her up for breakfast and noted that she was altered, not responding properly.  Vital signs significant for rectal temp of 92.6.  Abdomen is distended, scleral  icterus vs pallor noted bilaterally.  She opens her eyes to my voice and is able to follow my voice.  She is tachypneic and tachycardic, with grunting respirations, but maintaining airway. She is unable to follow commands. Will obtain lab work, imaging, placed on [employer], give fluids and broad-spectrum antibiotics.  Labwork significant for lactic >17, albumin 1.7 and elevated LFTs. CBG 38, amp of D50 given. Ammonia level 197. Rectal temperature slightly improved with Bair hugger. Stool is grossly hemoccult positive, suspect AMS in the setting of acute GI bleed, so protonix and octreotide given in addition to broad spectrum abx due to no definite cause of AMS yet. 2L IV fluids given. Hgb returned at 3.2. Blood ordered, but husband now at bedside, states patient is Jehovah's witness and would not wish to obtain blood products under any circumstances. Patient returned from CT mentation declining with asystole on monitor. Compressions began, but terminated efforts at husband and son's request. TOD called at 1009. ME declined case as no trauma identified on imaging of CT head and c-spine.  Death certificate filled out by Dr. Dalene Seltzer, as patient does not have PCP.  Final Clinical Impressions(s) / ED Diagnoses   Final diagnoses:  Cardiac arrest (HCC)  Low hemoglobin  Fall in home, initial encounter  Altered mental status, unspecified altered mental status type    ED Discharge Orders    None         Dietrich Pates, PA-C 07-06-18 2038    Alvira Monday, MD 06-Jul-2018 2148

## 2018-08-03 NOTE — ED Notes (Signed)
Patient transported to CT 

## 2018-08-03 NOTE — ED Notes (Signed)
Patient placement notified of need to bring pt to morgue. Pt placement following up  with Medical Examiner at this time.

## 2018-08-03 NOTE — Progress Notes (Signed)
Pharmacy Antibiotic Note  Maria Newman is a 58 y.o. female admitted on 07/20/18 with sepsis.  Pharmacy has been consulted for vancomycin/cefepime dosing. Low temps, WBC pending, LA >17. AKI - SCr 1.71 on admit (baseline ~0.5-0.7), CrCl~35.  Plan: Cefepime 2g IV x 1; then 2g IV q24h Vancomycin 2g IV x1; then 1g IV q24h Flagyl 500mg  IV q8h per MD Monitor clinical progress, c/s, renal function F/u de-escalation plan/LOT, vancomycin trough as indicated   Height: 5\' 3"  (160 cm) Weight: 175 lb (79.4 kg) IBW/kg (Calculated) : 52.4  Temp (24hrs), Avg:92.6 F (33.7 C), Min:92.6 F (33.7 C), Max:92.6 F (33.7 C)  Recent Labs  Lab 07/20/18 0827  LATICACIDVEN >17.00*    CrCl cannot be calculated (Patient's most recent lab result is older than the maximum 21 days allowed.).    No Known Allergies  Antimicrobials this admission: 1/3 vancomycin >>  1/3 cefepime >>  1/3 metronidazole >>  Dose adjustments this admission:   Microbiology results:   Maria Newman, PharmD, BCPS Clinical Pharmacist 20-Jul-2018 8:35 AM

## 2018-08-03 NOTE — Progress Notes (Signed)
Responded to page to support family at bedside. Patient deceased.  Passed on to Chaplain Long for continued support to family.  Provided emotional and spiritual support as needed.  Venida Jarvis, [place], [facility], Pager [phone]

## 2018-08-03 NOTE — ED Notes (Signed)
Patient returned from CT uncomfortable and rolling around in bed. While trying to calm patient, she declined in mentation and was no longer protecting her airway. Pt in asystole, CPR initiated. Husband and son bedside at this time. Per husband and son's wishes, CPR stopped. Time of death 37. Family remains bedside.

## 2018-08-03 NOTE — ED Notes (Addendum)
Per [facility], patient is potential tissue and eye donor. Reference number [id number]. Spoke with Cammy BrochureLatoshia Moore of [facility]. Family does not wish for patient to donate any of her organs.

## 2018-08-03 NOTE — Progress Notes (Signed)
I responded to a page from the Staff Chaplain to provide spiritual support for the patient's family. I visited the family in the ED and provided spiritual support through pastoral presence, words of comfort, and prayer. I waited with family members while the patient's mother traveled from [place]. I received next of kin and funeral home information and shared it with the nurse. I remained available to provide additional support as needed or requested by the family or staff.    07/07/18 1100  Clinical Encounter Type  Visited With Patient and family together  Visit Type Spiritual support;Death;ED  Referral From Nurse  Consult/Referral To Chaplain  Spiritual Encounters  Spiritual Needs Prayer;Emotional;Grief support  Stress Factors  Family Stress Factors Exhausted    Chaplain Dr Melvyn Novas

## 2018-08-03 NOTE — ED Notes (Addendum)
Patient's mother arrived to visit patient and say her goodbyes. Upon entering the room, patient's mother threw herself on the floor in distress. Patient's mother denies injury, assisted to chair by staff and provided with tissue box.

## 2018-08-03 NOTE — ED Triage Notes (Signed)
Pt arrived via [facility]; pt from [facility] w/ c/o fall hitting L side and hit lip three (3) days ago; husband called EMS today due to AMS when trying to awake for bkfst; abd been swelling 2 months ago but did not go to MD; pt had CVA at young age; CBG 60, pt rec'd 100 D10 and now 98; 105/50; 100% on 8L of O2; pt not really wanting to eat; last seen at 12 midnight eating chkn broth

## 2018-08-03 DEATH — deceased

## 2019-12-04 IMAGING — CT CT CERVICAL SPINE W/O CM
5 of 8 series · 13 of 33 positions shown, 14 images · non-contrast
Comparison: Limited cervical CT March 11, 2007; head CT [facility]

CLINICAL DATA: Recent fall.  Altered mental status.  Prior CVA

EXAM:
CT HEAD WITHOUT CONTRAST
CT CERVICAL SPINE WITHOUT CONTRAST
TECHNIQUE: Multidetector CT imaging of the head and cervical spine was
performed following the standard protocol without intravenous
contrast. Multiplanar CT image reconstructions of the cervical spine
were also generated.

[Series 5: head bone · axial · 0.42mm/px · z∈[-61,-5]mm · 2 of 86 slices shown]
[im 29/86  bone]
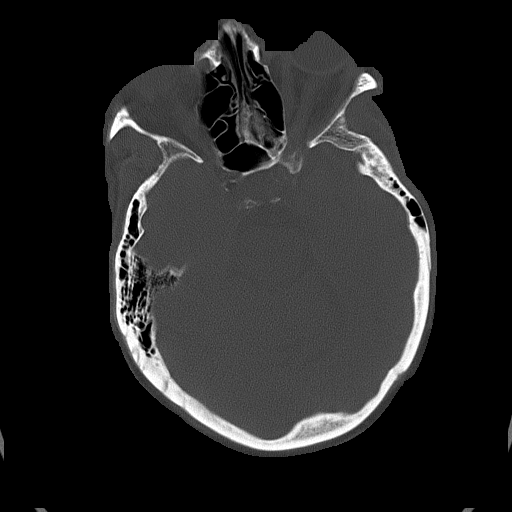
[im 57/86  bone]
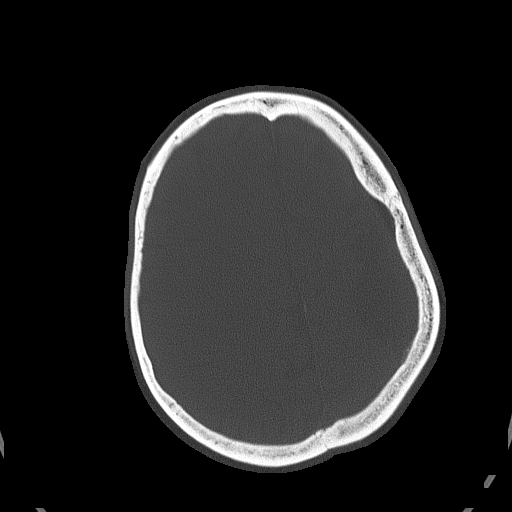

[Series 8: c_spine 2.0 st · axial · 0.34mm/px · z∈[-209,-145]mm · 2 of 96 slices shown, 3 images]
[im 32/96  soft-tissue]
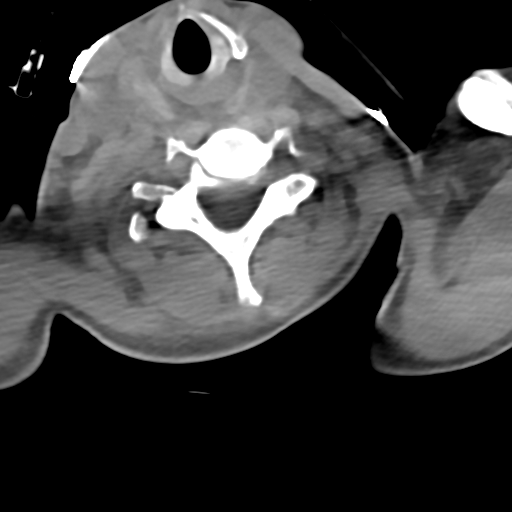
[im 32/96  bone]
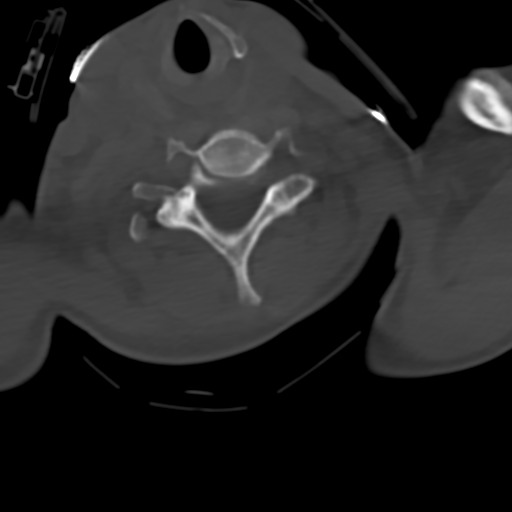
[im 64/96  bone]
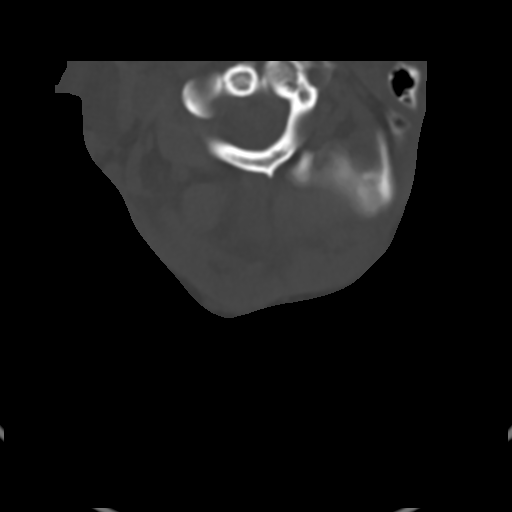

[Series 10: c_spine 2.0 sag bone · sagittal · 0.36mm/px · 5 of 64 slices shown]
[im 11/64  bone]
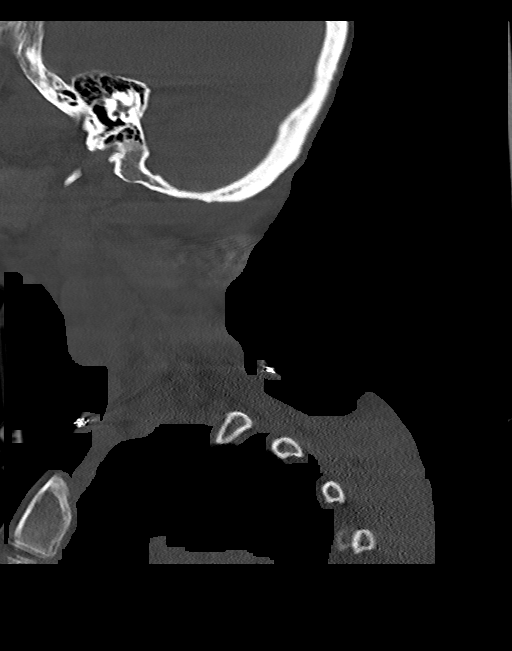
[im 22/64  bone]
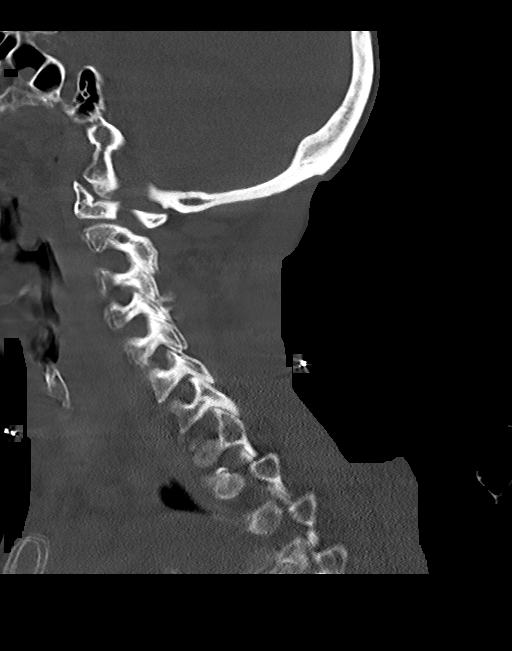
[im 32/64  bone]
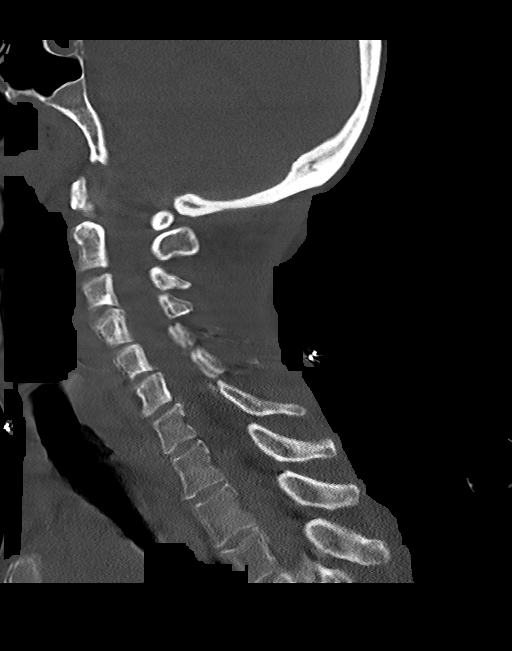
[im 43/64  bone]
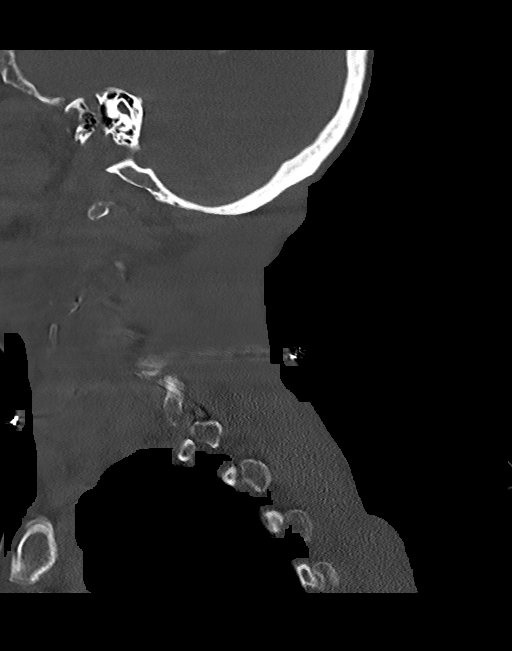
[im 53/64  bone]
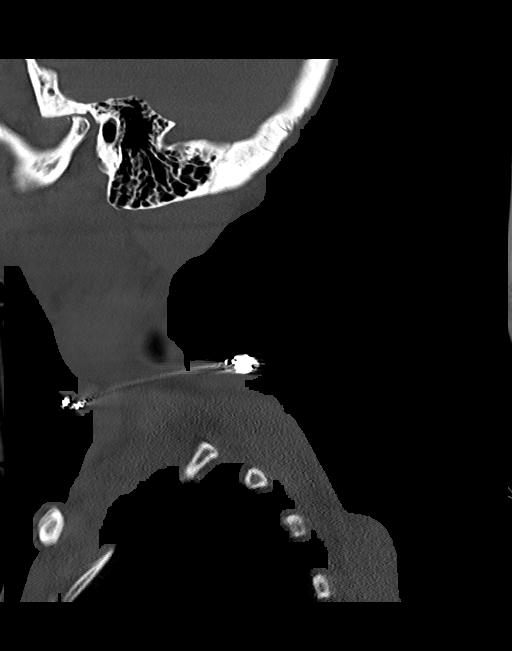

[Series 11: c_spine 2.0 cor bone · coronal · 0.28mm/px · 2 of 79 slices shown]
[im 27/79  bone]
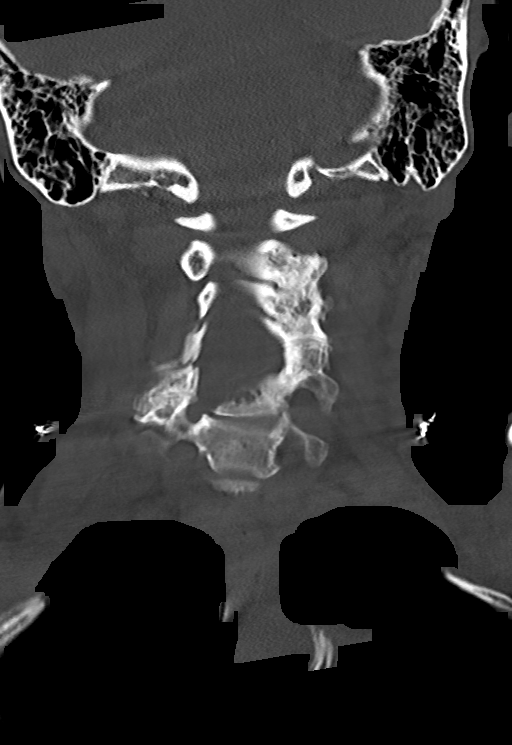
[im 53/79  bone]
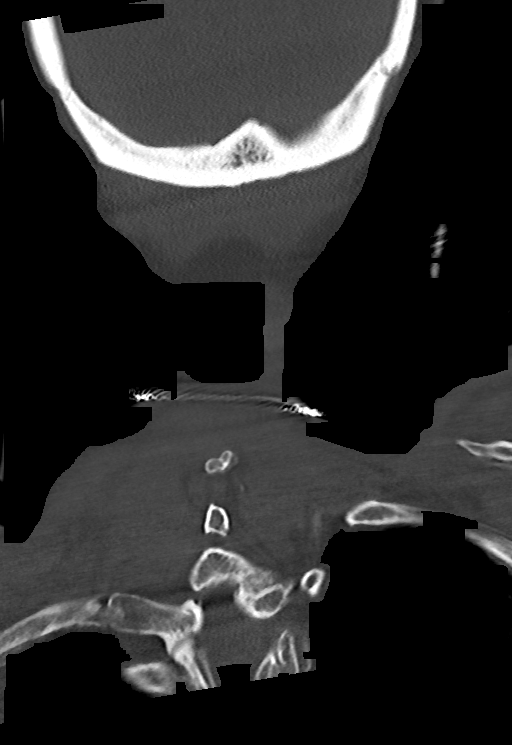

[Series 12: c_spine 2.0 orthogonals · axial · 0.21mm/px · z∈[-232,-180]mm · 2 of 91 slices shown]
[im 31/91  bone]
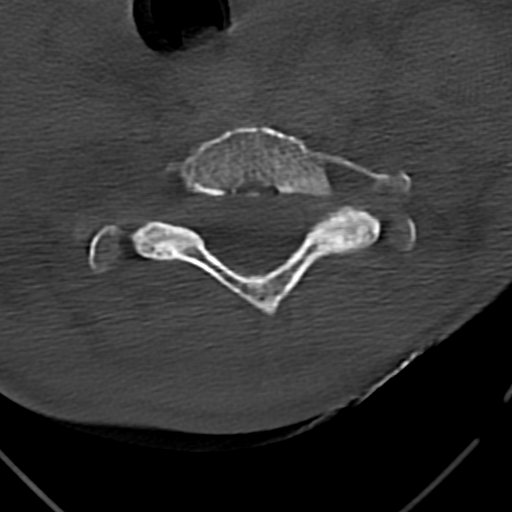
[im 61/91  bone]
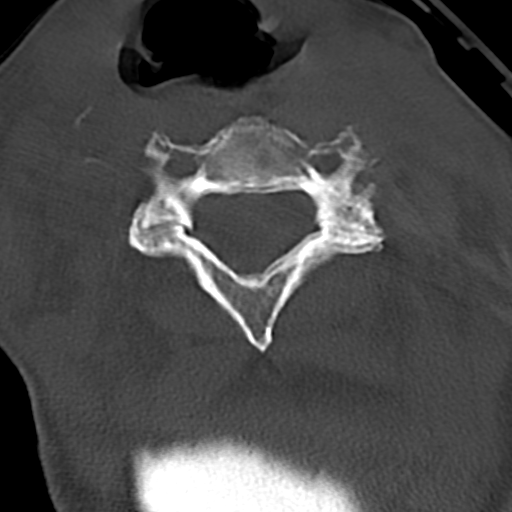

[13 of 33 positions shown; findings below may reference images not displayed]

FINDINGS: CT HEAD FINDINGS

Brain: There is relative enlargement of the left lateral ventricle
compared to the right lateral ventricle, a stable finding, felt to
be due to ex vacuo phenomenon on the left. There is slight midline
shift toward the left, felt to be due to ex vacuo phenomenon. There
is evidence of a prior infarct involving portions of the posterior
left frontal, much of the left temporal, and a portion of the left
anterior parietal lobes. There is no mass, hemorrhage, or
extra-axial fluid collection. There is evident small vessel disease
involving the anterior limb of the left internal capsule. No acute
infarct is demonstrable on this study.

Vascular: No hyperdense vessel. There is calcification in each
carotid siphon region.

Skull: The bony calvarium appears intact.

Sinuses/Orbits: There is opacification in a portion of the left
sphenoid sinus. Other paranasal sinuses are clear. Orbits appear
symmetric bilaterally.

Other: Mastoid air cells are clear. There is extensive
osteoarthritic change in each temporomandibular joint.

CT CERVICAL SPINE FINDINGS

There is a degree of motion artifact making this study less than
optimal.

Alignment: There is no appreciable spondylolisthesis.

Skull base and vertebrae: Skull base and craniocervical junction
regions appear unremarkable. No fracture is evident given
limitations due to patient motion. No blastic or lytic bone lesions
are evident.

Soft tissues and spinal canal: Prevertebral soft tissues and
predental space regions are normal. No cord or canal hematoma is
demonstrable with limitations due to motion. No paraspinous lesions
evident.

Disc levels: There is slight disc space narrowing at C4-5, C5-6, and
C6-7. There is facet hypertrophy at multiple levels without disc
extrusion or stenosis noted. No appreciable nerve root edema or
effacement is evident.

Upper chest: There is evidence of a left pleural effusion. There is
aortic atherosclerosis.

Other: There is evidence of calcification in the right carotid
artery.
IMPRESSION: CT head: Stable prior infarct on the left involving much of the left
temporal lobe as well as portions of the posterior left frontal and
anterior left parietal lobes. There is ex vacuo phenomenon on the
left with relative enlargement of the left lateral ventricle. There
is slight midline shift toward the left due to this ex vacuo
phenomenon. No acute infarct evident. No mass or hemorrhage.

There are foci of arterial vascular calcification. There is left
sphenoid sinus disease. There is arthropathy in each
temporomandibular joint.

CT cervical spine: Significant limitations due to motion. No
fracture is demonstrated. No spondylolisthesis. Osteoarthritic
change noted at several levels. There is aortic atherosclerosis as
well as right carotid artery calcification.

Aortic Atherosclerosis ([id number]).
